# Patient Record
Sex: Male | Born: 1995 | Race: White | Hispanic: No | Smoking: Never smoker
Health system: Southern US, Community
[De-identification: ages and names within clinical notes are randomized; demographics above are authoritative.]

## PROBLEM LIST (undated history)

## (undated) HISTORY — PX: OTHER SURGICAL HISTORY: SHX169

---
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
## 2020-07-24 ENCOUNTER — Other Ambulatory Visit: Payer: PRIVATE HEALTH INSURANCE

## 2020-07-24 DIAGNOSIS — Z20822 Contact with and (suspected) exposure to covid-19: Secondary | ICD-10-CM

## 2020-07-25 LAB — SARS-COV-2, NAA 2 DAY TAT

## 2020-07-25 LAB — NOVEL CORONAVIRUS, NAA: SARS-CoV-2, NAA: NOT DETECTED

## 2020-07-31 ENCOUNTER — Other Ambulatory Visit: Payer: Self-pay
